# Patient Record
Sex: Male | Born: 1955 | Race: White | Hispanic: No | Marital: Married | Smoking: Former smoker
Health system: Southern US, Community
[De-identification: ages and names within clinical notes are randomized; demographics above are authoritative.]

## PROBLEM LIST (undated history)

## (undated) DIAGNOSIS — E785 Hyperlipidemia, unspecified: Secondary | ICD-10-CM

## (undated) DIAGNOSIS — I10 Essential (primary) hypertension: Secondary | ICD-10-CM

## (undated) DIAGNOSIS — F329 Major depressive disorder, single episode, unspecified: Secondary | ICD-10-CM

## (undated) DIAGNOSIS — E119 Type 2 diabetes mellitus without complications: Secondary | ICD-10-CM

## (undated) DIAGNOSIS — F32A Depression, unspecified: Secondary | ICD-10-CM

## (undated) HISTORY — DX: Type 2 diabetes mellitus without complications: E11.9

## (undated) HISTORY — DX: Major depressive disorder, single episode, unspecified: F32.9

## (undated) HISTORY — DX: Hyperlipidemia, unspecified: E78.5

## (undated) HISTORY — DX: Depression, unspecified: F32.A

## (undated) HISTORY — DX: Essential (primary) hypertension: I10

## (undated) HISTORY — PX: CERVICAL DISCECTOMY: SHX98

---

## 2008-04-29 ENCOUNTER — Emergency Department (HOSPITAL_COMMUNITY): Admission: EM | Admit: 2008-04-29 | Discharge: 2008-04-29 | Payer: Self-pay | Admitting: Family Medicine

## 2008-06-14 LAB — HM COLONOSCOPY

## 2010-05-04 ENCOUNTER — Encounter: Payer: Self-pay | Admitting: Internal Medicine

## 2010-05-04 ENCOUNTER — Ambulatory Visit (INDEPENDENT_AMBULATORY_CARE_PROVIDER_SITE_OTHER): Payer: Managed Care, Other (non HMO) | Admitting: Internal Medicine

## 2010-05-04 DIAGNOSIS — Z860100 Personal history of colon polyps, unspecified: Secondary | ICD-10-CM | POA: Insufficient documentation

## 2010-05-04 DIAGNOSIS — Z0000 Encounter for general adult medical examination without abnormal findings: Secondary | ICD-10-CM

## 2010-05-04 DIAGNOSIS — E349 Endocrine disorder, unspecified: Secondary | ICD-10-CM

## 2010-05-04 DIAGNOSIS — I10 Essential (primary) hypertension: Secondary | ICD-10-CM | POA: Insufficient documentation

## 2010-05-04 DIAGNOSIS — E291 Testicular hypofunction: Secondary | ICD-10-CM

## 2010-05-04 MED ORDER — TESTOSTERONE 20.25 MG/ACT (1.62%) TD GEL: 2.0000 "application " | TRANSDERMAL | Status: DC

## 2010-05-04 MED ORDER — LOSARTAN POTASSIUM 50 MG PO TABS: 25.0000 mg | ORAL_TABLET | Freq: Every day | ORAL | Status: DC

## 2010-05-04 NOTE — Patient Instructions (Signed)
Please check your blood pressure on a regular basis.  If it is consistently greater than 150/90, please make an office appointment.  Limit your sodium (Salt) intake    It is important that you exercise regularly, at least 20 minutes 3 to 4 times per week.  If you develop chest pain or shortness of breath seek  medical attention.  You need to lose weight.  Consider a lower calorie diet and regular exercise.  Return in 6 months for follow-up

## 2010-05-04 NOTE — Progress Notes (Signed)
  Subjective:    Patient ID: Matthew Lambert, male    DOB: 1955/04/28, 55 y.o.   MRN: [id number]  HPI  55 year old patient who is seen today to establish with our practice. He has a five-year history of treated hypertension and has down titrated his losartan to 25 mg every other day. He is exercising regularly and losing weight and does monitor home blood pressure readings frequently. Blood pressure readings are generally normal to high normal. He has a history of colonic polyps and his last colonoscopy was in June of 2010. He has cervical disc disease and is status post cervical discectomy in 1989 in [place]. He has testosterone deficiency and had local allergic reaction to Androderm patch.    Review of Systems  Constitutional: Negative for fever, chills, activity change, appetite change and fatigue.  HENT: Negative for hearing loss, ear pain, congestion, rhinorrhea, sneezing, mouth sores, trouble swallowing, neck pain, neck stiffness, dental problem, voice change, sinus pressure and tinnitus.   Eyes: Negative for photophobia, pain, redness and visual disturbance.  Respiratory: Negative for apnea, cough, choking, chest tightness, shortness of breath and wheezing.   Cardiovascular: Negative for chest pain, palpitations and leg swelling.  Gastrointestinal: Negative for nausea, vomiting, abdominal pain, diarrhea, constipation, blood in stool, abdominal distention, anal bleeding and rectal pain.  Genitourinary: Negative for dysuria, urgency, frequency, hematuria, flank pain, decreased urine volume, discharge, penile swelling, scrotal swelling, difficulty urinating, genital sores and testicular pain.  Musculoskeletal: Negative for myalgias, back pain, joint swelling, arthralgias and gait problem.  Skin: Negative for color change, rash and wound.  Neurological: Negative for dizziness, tremors, seizures, syncope, facial asymmetry, speech difficulty, weakness, light-headedness, numbness and  headaches.  Hematological: Negative for adenopathy. Does not bruise/bleed easily.  Psychiatric/Behavioral: Negative for suicidal ideas, hallucinations, behavioral problems, confusion, sleep disturbance, self-injury, dysphoric mood, decreased concentration and agitation. The patient is not nervous/anxious.        Objective:   Physical Exam  Constitutional: He appears well-developed and well-nourished.       Mildly overweight. Blood pressure 140/80  HENT:  Head: Normocephalic and atraumatic.  Right Ear: External ear normal.  Left Ear: External ear normal.  Nose: Nose normal.  Mouth/Throat: Oropharynx is clear and moist.  Eyes: Conjunctivae and EOM are normal. Pupils are equal, round, and reactive to light. No scleral icterus.  Neck: Normal range of motion. Neck supple. No JVD present. No thyromegaly present.  Cardiovascular: Regular rhythm, normal heart sounds and intact distal pulses.  Exam reveals no gallop and no friction rub.   No murmur heard. Pulmonary/Chest: Effort normal and breath sounds normal. He exhibits no tenderness.  Abdominal: Soft. Bowel sounds are normal. He exhibits no distension and no mass. There is no tenderness.  Genitourinary: Penis normal.  Musculoskeletal: Normal range of motion. He exhibits no edema and no tenderness.  Lymphadenopathy:    He has no cervical adenopathy.  Neurological: He is alert. He has normal reflexes. No cranial nerve deficit. Coordination normal.  Skin: Skin is warm and dry. No rash noted.  Psychiatric: He has a normal mood and affect. His behavior is normal.          Assessment & Plan:   Hypertension. Have encouraged the patient to take losartan 25 mg daily and will continue aggressive lifestyle changes and close outpatient monitoring. Colonic polyps Testosterone deficiency. We'll resume replacement medication

## 2010-05-09 ENCOUNTER — Telehealth: Payer: Self-pay | Admitting: Internal Medicine

## 2010-05-09 MED ORDER — TESTOSTERONE 20.25 MG/ACT (1.62%) TD GEL: 2.0000 "application " | TRANSDERMAL | Status: DC

## 2010-05-09 NOTE — Telephone Encounter (Signed)
Please advise - did you mean qd? Instead of 1 dose over 46hours?

## 2010-05-09 NOTE — Telephone Encounter (Signed)
Spoke with pharmacy r/t rx directions. KIK Spoke with wife about call to pharmacy

## 2010-05-09 NOTE — Telephone Encounter (Signed)
Daily is correct.

## 2010-05-09 NOTE — Telephone Encounter (Signed)
Pt called and said that [facility] at [address] needs clarification on the Androgel # of refills and also on dosage instructions. Pls call pharmacist at [phone]

## 2010-05-23 ENCOUNTER — Encounter: Payer: Self-pay | Admitting: Internal Medicine

## 2010-10-30 ENCOUNTER — Other Ambulatory Visit (INDEPENDENT_AMBULATORY_CARE_PROVIDER_SITE_OTHER): Payer: Managed Care, Other (non HMO)

## 2010-10-30 DIAGNOSIS — Z0000 Encounter for general adult medical examination without abnormal findings: Secondary | ICD-10-CM

## 2010-10-30 LAB — HEPATIC FUNCTION PANEL
ALT: 16 U/L (ref 0–53)
AST: 17 U/L (ref 0–37)
Albumin: 3.8 g/dL (ref 3.5–5.2)
Alkaline Phosphatase: 53 U/L (ref 39–117)
Bilirubin, Direct: 0.1 mg/dL (ref 0.0–0.3)
Total Bilirubin: 0.8 mg/dL (ref 0.3–1.2)
Total Protein: 6.2 g/dL (ref 6.0–8.3)

## 2010-10-30 LAB — PSA: PSA: 0.69 ng/mL (ref 0.10–4.00)

## 2010-10-30 LAB — CBC WITH DIFFERENTIAL/PLATELET
Basophils Absolute: 0 10*3/uL (ref 0.0–0.1)
Basophils Relative: 0.4 % (ref 0.0–3.0)
Eosinophils Absolute: 0.1 10*3/uL (ref 0.0–0.7)
Eosinophils Relative: 1.8 % (ref 0.0–5.0)
HCT: 43.2 % (ref 39.0–52.0)
Hemoglobin: 14.6 g/dL (ref 13.0–17.0)
Lymphocytes Relative: 32.1 % (ref 12.0–46.0)
Lymphs Abs: 1.9 10*3/uL (ref 0.7–4.0)
MCHC: 33.9 g/dL (ref 30.0–36.0)
MCV: 88.2 fl (ref 78.0–100.0)
Monocytes Absolute: 0.6 10*3/uL (ref 0.1–1.0)
Monocytes Relative: 9.6 % (ref 3.0–12.0)
Neutro Abs: 3.3 10*3/uL (ref 1.4–7.7)
Neutrophils Relative %: 56.1 % (ref 43.0–77.0)
Platelets: 209 10*3/uL (ref 150.0–400.0)
RBC: 4.89 Mil/uL (ref 4.22–5.81)
RDW: 13.2 % (ref 11.5–14.6)
WBC: 5.9 10*3/uL (ref 4.5–10.5)

## 2010-10-30 LAB — TSH: TSH: 1.76 u[IU]/mL (ref 0.35–5.50)

## 2010-10-30 LAB — POCT URINALYSIS DIPSTICK
Bilirubin, UA: NEGATIVE
Blood, UA: NEGATIVE
Glucose, UA: NEGATIVE
Ketones, UA: NEGATIVE
Leukocytes, UA: NEGATIVE
Nitrite, UA: NEGATIVE
Protein, UA: NEGATIVE
Spec Grav, UA: 1.02
Urobilinogen, UA: 0.2
pH, UA: 5.5

## 2010-10-30 LAB — BASIC METABOLIC PANEL
BUN: 20 mg/dL (ref 6–23)
CO2: 28 mEq/L (ref 19–32)
Calcium: 8.7 mg/dL (ref 8.4–10.5)
Chloride: 104 mEq/L (ref 96–112)
Creatinine, Ser: 1 mg/dL (ref 0.4–1.5)
GFR: 78.46 mL/min (ref >60.00)
Glucose, Bld: 105 mg/dL — ABNORMAL HIGH (ref 70–99)
Potassium: 4.3 mEq/L (ref 3.5–5.1)
Sodium: 140 mEq/L (ref 135–145)

## 2010-10-30 LAB — LIPID PANEL
Cholesterol: 169 mg/dL (ref 0–200)
HDL: 44.8 mg/dL (ref >39.00)
LDL Cholesterol: 101 mg/dL — ABNORMAL HIGH (ref 0–99)
Total CHOL/HDL Ratio: 4
Triglycerides: 117 mg/dL (ref 0.0–149.0)
VLDL: 23.4 mg/dL (ref 0.0–40.0)

## 2010-11-06 ENCOUNTER — Encounter: Payer: Managed Care, Other (non HMO) | Admitting: Internal Medicine

## 2010-11-15 ENCOUNTER — Encounter: Payer: Self-pay | Admitting: Internal Medicine

## 2010-11-15 ENCOUNTER — Ambulatory Visit (INDEPENDENT_AMBULATORY_CARE_PROVIDER_SITE_OTHER): Payer: Managed Care, Other (non HMO) | Admitting: Internal Medicine

## 2010-11-15 VITALS — BP 130/80 | HR 68 | Temp 97.8°F | Resp 16 | Ht 70.5 in | Wt 204.0 lb

## 2010-11-15 DIAGNOSIS — E291 Testicular hypofunction: Secondary | ICD-10-CM

## 2010-11-15 DIAGNOSIS — Z86010 Personal history of colonic polyps: Secondary | ICD-10-CM

## 2010-11-15 DIAGNOSIS — I10 Essential (primary) hypertension: Secondary | ICD-10-CM

## 2010-11-15 DIAGNOSIS — Z23 Encounter for immunization: Secondary | ICD-10-CM

## 2010-11-15 DIAGNOSIS — E349 Endocrine disorder, unspecified: Secondary | ICD-10-CM

## 2010-11-15 DIAGNOSIS — Z0000 Encounter for general adult medical examination without abnormal findings: Secondary | ICD-10-CM

## 2010-11-15 MED ORDER — LOSARTAN POTASSIUM 50 MG PO TABS: 50.0000 mg | ORAL_TABLET | Freq: Every day | ORAL | Status: DC

## 2010-11-15 MED ORDER — TESTOSTERONE 20.25 MG/ACT (1.62%) TD GEL: 2.0000 "application " | TRANSDERMAL | Status: DC

## 2010-11-15 NOTE — Patient Instructions (Signed)
Return in one year for follow-up  Please check your blood pressure on a regular basis.  If it is consistently greater than 150/90, please make an office appointment.  Limit your sodium (Salt) intake    It is important that you exercise regularly, at least 20 minutes 3 to 4 times per week.  If you develop chest pain or shortness of breath seek  medical attention.

## 2010-11-15 NOTE — Progress Notes (Signed)
Subjective:    Patient ID: Matthew Lambert, male    DOB: [birth year], 55 y.o.   MRN: [id number]  HPI 55 year old patient who is seen today for an annual exam. He has treated hypertension history of testosterone deficiency and also a history of colonic polyps. His last colonoscopy was 2010. He is on a 5 year interval. No concerns or complaints. He remains quite active with biking and has no exercise limitations. He denies any cardiopulmonary complaints. He does have a brother who is status post CABG.   Past Medical History  Diagnosis Date  . Depression   . Hypertension   . Hyperlipidemia    Past Surgical History  Procedure Date  . Cervical discectomy     reports that he quit smoking about 13 years ago. He has never used smokeless tobacco. He reports that he drinks alcohol. He reports that he does not use illicit drugs. family history is not on file. No Known Allergies Review of Systems  Constitutional: Negative for fever, chills, activity change, appetite change and fatigue.  HENT: Negative for hearing loss, ear pain, congestion, rhinorrhea, sneezing, mouth sores, trouble swallowing, neck pain, neck stiffness, dental problem, voice change, sinus pressure and tinnitus.   Eyes: Negative for photophobia, pain, redness and visual disturbance.  Respiratory: Negative for apnea, cough, choking, chest tightness, shortness of breath and wheezing.   Cardiovascular: Negative for chest pain, palpitations and leg swelling.  Gastrointestinal: Negative for nausea, vomiting, abdominal pain, diarrhea, constipation, blood in stool, abdominal distention, anal bleeding and rectal pain.  Genitourinary: Negative for dysuria, urgency, frequency, hematuria, flank pain, decreased urine volume, discharge, penile swelling, scrotal swelling, difficulty urinating, genital sores and testicular pain.  Musculoskeletal: Negative for myalgias, back pain, joint swelling, arthralgias and gait problem.  Skin: Negative for color  change, rash and wound.  Neurological: Negative for dizziness, tremors, seizures, syncope, facial asymmetry, speech difficulty, weakness, light-headedness, numbness and headaches.  Hematological: Negative for adenopathy. Does not bruise/bleed easily.  Psychiatric/Behavioral: Negative for suicidal ideas, hallucinations, behavioral problems, confusion, sleep disturbance, self-injury, dysphoric mood, decreased concentration and agitation. The patient is not nervous/anxious.        Objective:   Physical Exam  Constitutional: He appears well-developed and well-nourished.  HENT:  Head: Normocephalic and atraumatic.  Right Ear: External ear normal.  Left Ear: External ear normal.  Nose: Nose normal.  Mouth/Throat: Oropharynx is clear and moist.  Eyes: Conjunctivae and EOM are normal. Pupils are equal, round, and reactive to light. No scleral icterus.  Neck: Normal range of motion. Neck supple. No JVD present. No thyromegaly present.  Cardiovascular: Regular rhythm, normal heart sounds and intact distal pulses.  Exam reveals no gallop and no friction rub.   No murmur heard. Pulmonary/Chest: Effort normal and breath sounds normal. He exhibits no tenderness.  Abdominal: Soft. Bowel sounds are normal. He exhibits no distension and no mass. There is no tenderness.  Genitourinary: Prostate normal and penis normal.  Musculoskeletal: Normal range of motion. He exhibits no edema and no tenderness.  Lymphadenopathy:    He has no cervical adenopathy.  Neurological: He is alert. He has normal reflexes. No cranial nerve deficit. Coordination normal.  Skin: Skin is warm and dry. No rash noted.  Psychiatric: He has a normal mood and affect. His behavior is normal.          Assessment & Plan:   Preventive health care Hypertension well controlled. We'll continue present regimen and home blood pressure monitoring Colonic polyps followup colonoscopy in 3 years Testosterone  deficiency. We'll continue  present treatment  Recheck one year

## 2010-11-22 ENCOUNTER — Other Ambulatory Visit: Payer: Self-pay | Admitting: Internal Medicine

## 2010-11-22 NOTE — Telephone Encounter (Signed)
Called in.

## 2011-11-01 ENCOUNTER — Other Ambulatory Visit: Payer: Managed Care, Other (non HMO)

## 2011-11-16 ENCOUNTER — Encounter: Payer: Managed Care, Other (non HMO) | Admitting: Internal Medicine

## 2018-10-27 DIAGNOSIS — I10 Essential (primary) hypertension: Secondary | ICD-10-CM | POA: Diagnosis not present

## 2018-10-27 DIAGNOSIS — Z8249 Family history of ischemic heart disease and other diseases of the circulatory system: Secondary | ICD-10-CM | POA: Diagnosis not present

## 2018-10-27 DIAGNOSIS — Z0000 Encounter for general adult medical examination without abnormal findings: Secondary | ICD-10-CM | POA: Diagnosis not present

## 2018-10-28 DIAGNOSIS — Z0000 Encounter for general adult medical examination without abnormal findings: Secondary | ICD-10-CM | POA: Diagnosis not present

## 2018-10-28 DIAGNOSIS — Z13220 Encounter for screening for lipoid disorders: Secondary | ICD-10-CM | POA: Diagnosis not present

## 2019-04-06 DIAGNOSIS — R7309 Other abnormal glucose: Secondary | ICD-10-CM | POA: Diagnosis not present

## 2019-04-20 DIAGNOSIS — I10 Essential (primary) hypertension: Secondary | ICD-10-CM | POA: Diagnosis not present

## 2019-04-20 DIAGNOSIS — R739 Hyperglycemia, unspecified: Secondary | ICD-10-CM | POA: Diagnosis not present

## 2019-10-06 DIAGNOSIS — R7301 Impaired fasting glucose: Secondary | ICD-10-CM | POA: Diagnosis not present

## 2019-10-06 DIAGNOSIS — Z125 Encounter for screening for malignant neoplasm of prostate: Secondary | ICD-10-CM | POA: Diagnosis not present

## 2019-10-06 DIAGNOSIS — Z0000 Encounter for general adult medical examination without abnormal findings: Secondary | ICD-10-CM | POA: Diagnosis not present

## 2019-10-06 DIAGNOSIS — I10 Essential (primary) hypertension: Secondary | ICD-10-CM | POA: Diagnosis not present

## 2019-10-06 DIAGNOSIS — R7309 Other abnormal glucose: Secondary | ICD-10-CM | POA: Diagnosis not present

## 2019-10-15 DIAGNOSIS — R739 Hyperglycemia, unspecified: Secondary | ICD-10-CM | POA: Diagnosis not present

## 2019-10-15 DIAGNOSIS — Z23 Encounter for immunization: Secondary | ICD-10-CM | POA: Diagnosis not present

## 2019-10-15 DIAGNOSIS — D485 Neoplasm of uncertain behavior of skin: Secondary | ICD-10-CM | POA: Diagnosis not present

## 2019-10-15 DIAGNOSIS — I10 Essential (primary) hypertension: Secondary | ICD-10-CM | POA: Diagnosis not present

## 2019-10-15 DIAGNOSIS — Z8249 Family history of ischemic heart disease and other diseases of the circulatory system: Secondary | ICD-10-CM | POA: Diagnosis not present

## 2019-10-15 DIAGNOSIS — Z1212 Encounter for screening for malignant neoplasm of rectum: Secondary | ICD-10-CM | POA: Diagnosis not present

## 2019-10-15 DIAGNOSIS — Z0001 Encounter for general adult medical examination with abnormal findings: Secondary | ICD-10-CM | POA: Diagnosis not present

## 2019-10-26 DIAGNOSIS — I25119 Atherosclerotic heart disease of native coronary artery with unspecified angina pectoris: Secondary | ICD-10-CM | POA: Diagnosis not present

## 2019-10-26 DIAGNOSIS — E119 Type 2 diabetes mellitus without complications: Secondary | ICD-10-CM | POA: Diagnosis not present

## 2019-11-04 ENCOUNTER — Encounter: Payer: Self-pay | Admitting: General Practice

## 2019-11-09 DIAGNOSIS — I25119 Atherosclerotic heart disease of native coronary artery with unspecified angina pectoris: Secondary | ICD-10-CM | POA: Diagnosis not present

## 2019-11-10 ENCOUNTER — Telehealth: Payer: Self-pay | Admitting: Cardiology

## 2019-11-10 NOTE — Telephone Encounter (Signed)
Patient's wife called to ensure that our office has received patient's CT calcium score. She states [facility] faxed it to our office. She is unaware of when it was faxed, however, she would like to ensure that we have the results prior to appointment scheduled for 11/12/19 with Dr. Jens Som. Please call.

## 2019-11-10 NOTE — Telephone Encounter (Signed)
Patient's wife called to check on the status regarding if we have received the fax results. Advised we are waiting to find out currently and would call her when we know. Patient verbalized she is okay with this.

## 2019-11-11 DIAGNOSIS — I10 Essential (primary) hypertension: Secondary | ICD-10-CM | POA: Diagnosis not present

## 2019-11-11 DIAGNOSIS — I25119 Atherosclerotic heart disease of native coronary artery with unspecified angina pectoris: Secondary | ICD-10-CM | POA: Diagnosis not present

## 2019-11-11 DIAGNOSIS — R7303 Prediabetes: Secondary | ICD-10-CM | POA: Diagnosis not present

## 2019-11-11 NOTE — Telephone Encounter (Signed)
Left message for pt wife, aware CT results obtained via care everywhere.

## 2019-11-12 ENCOUNTER — Other Ambulatory Visit: Payer: Self-pay

## 2019-11-12 ENCOUNTER — Ambulatory Visit (INDEPENDENT_AMBULATORY_CARE_PROVIDER_SITE_OTHER): Payer: BC Managed Care – PPO | Admitting: Cardiology

## 2019-11-12 ENCOUNTER — Encounter: Payer: Self-pay | Admitting: Cardiology

## 2019-11-12 VITALS — BP 150/73 | HR 55 | Temp 96.8°F | Ht 71.0 in | Wt 219.2 lb

## 2019-11-12 DIAGNOSIS — R072 Precordial pain: Secondary | ICD-10-CM | POA: Diagnosis not present

## 2019-11-12 DIAGNOSIS — I2510 Atherosclerotic heart disease of native coronary artery without angina pectoris: Secondary | ICD-10-CM | POA: Diagnosis not present

## 2019-11-12 DIAGNOSIS — E7800 Pure hypercholesterolemia, unspecified: Secondary | ICD-10-CM

## 2019-11-12 DIAGNOSIS — I10 Essential (primary) hypertension: Secondary | ICD-10-CM | POA: Diagnosis not present

## 2019-11-12 DIAGNOSIS — R0989 Other specified symptoms and signs involving the circulatory and respiratory systems: Secondary | ICD-10-CM

## 2019-11-12 MED ORDER — METOPROLOL TARTRATE 50 MG PO TABS: ORAL_TABLET | ORAL | 0 refills | Status: DC

## 2019-11-12 MED ORDER — METOPROLOL TARTRATE 50 MG PO TABS
ORAL_TABLET | ORAL | 0 refills | Status: DC
Start: 2019-11-12 — End: 2020-05-03

## 2019-11-12 MED ORDER — ROSUVASTATIN CALCIUM 40 MG PO TABS: 40.0000 mg | ORAL_TABLET | Freq: Every day | ORAL | 3 refills | Status: DC

## 2019-11-12 NOTE — Progress Notes (Signed)
Referring-Matthew Renne Crigler, MD Reason for referral-elevated calcium score and exertional chest pain  HPI: 64 year old male for evaluation of elevated calcium score and exertional chest pain at request of Merri Brunette, MD.  Calcium score October 2021 439.  Patient has dyspnea with more vigorous activities.  He denies dyspnea with routine activities.  No orthopnea, PND, pedal edema or syncope.  No claudication.  He did have an episode of chest discomfort back in the spring that occurred with vigorous activities and resolve with rest.  He has had no chest pain since then.  Cardiology now asked to evaluate.  Current Outpatient Medications  Medication Sig Dispense Refill  . acetaminophen (TYLENOL) 650 MG CR tablet Take by mouth.    . calcium carbonate (OS-CAL) 1250 (500 Ca) MG chewable tablet Chew by mouth.    . [facility] ASPIRIN EC 81 MG EC tablet 81 mg.    . ibuprofen (ADVIL) 200 MG tablet Take by mouth.    . losartan (COZAAR) 50 MG tablet Take 1 tablet (50 mg total) by mouth daily. 30 day rx please (Patient taking differently: Take 25 mg by mouth daily. 30 day rx please TAKE 25MG  TOTAL) 90 tablet 6  . nitroGLYCERIN (NITROSTAT) 0.4 MG SL tablet SMARTSIG:1 Sublingual Every Night    . rosuvastatin (CRESTOR) 10 MG tablet Take 10 mg by mouth at bedtime.     No current facility-administered medications for this visit.    No Known Allergies   Past Medical History:  Diagnosis Date  . Depression   . Diabetes mellitus (HCC)   . Hyperlipidemia   . Hypertension     Past Surgical History:  Procedure Laterality Date  . CERVICAL DISCECTOMY      Social History   Socioeconomic History  . Marital status: Married    Spouse name: Not on file  . Number of children: 2  . Years of education: Not on file  . Highest education level: Not on file  Occupational History  . Not on file  Tobacco Use  . Smoking status: Former Smoker    Quit date: 01/01/1997    Years since quitting: 22.8  . Smokeless  tobacco: Never Used  Substance and Sexual Activity  . Alcohol use: Yes    Comment: Occasional  . Drug use: No  . Sexual activity: Not on file  Other Topics Concern  . Not on file  Social History Narrative  . Not on file   Social Determinants of Health   Financial Resource Strain:   . Difficulty of Paying Living Expenses: Not on file  Food Insecurity:   . Worried About 03/01/1997 in the Last Year: Not on file  . Ran Out of Food in the Last Year: Not on file  Transportation Needs:   . Lack of Transportation (Medical): Not on file  . Lack of Transportation (Non-Medical): Not on file  Physical Activity:   . Days of Exercise per Week: Not on file  . Minutes of Exercise per Session: Not on file  Stress:   . Feeling of Stress : Not on file  Social Connections:   . Frequency of Communication with Friends and Family: Not on file  . Frequency of Social Gatherings with Friends and Family: Not on file  . Attends Religious Services: Not on file  . Active Member of Clubs or Organizations: Not on file  . Attends Programme researcher, broadcasting/film/video Meetings: Not on file  . Marital Status: Not on file  Intimate Partner Violence:   .  Fear of Current or Ex-Partner: Not on file  . Emotionally Abused: Not on file  . Physically Abused: Not on file  . Sexually Abused: Not on file    Family History  Problem Relation Age of Onset  . CAD Father   . CAD Brother     ROS: no fevers or chills, productive cough, hemoptysis, dysphasia, odynophagia, melena, hematochezia, dysuria, hematuria, rash, seizure activity, orthopnea, PND, pedal edema, claudication. Remaining systems are negative.  Physical Exam:   Blood pressure (!) 150/73, pulse (!) 55, temperature (!) 96.8 F (36 C), height 5\' 11"  (1.803 m), weight 219 lb 3.2 oz (99.4 kg), SpO2 97 %.  General:  Well developed/well nourished in NAD Skin warm/dry Patient not depressed No peripheral clubbing Back-normal HEENT-normal/normal eyelids Neck  supple/normal carotid upstroke bilaterally; no bruits; no JVD; no thyromegaly chest - CTA/ normal expansion CV - RRR/normal S1 and S2; no murmurs, rubs or gallops;  PMI nondisplaced Abdomen -NT/ND, no HSM, no mass, + bowel sounds, positive bruit 2+ femoral pulses, no bruits Ext-no edema, chords, 2+ DP Neuro-grossly nonfocal  ECG -sinus bradycardia at a rate of 55, no ST changes.  Personally reviewed  A/P  1 chest pain-patient had an episode of chest discomfort with vigorous activities in the spring.  He also notes dyspnea on exertion.  He has a strong family history of coronary disease with his brother having bypass in his 61s and his father having his first myocardial infarction in his 68s as well.  We will arrange a cardiac CTA to exclude obstructive disease.  2 coronary artery disease-based on elevated calcium score.  We will treat with aspirin and statin.  3 hypertension-blood pressure mildly elevated.  I have asked him to follow this and we will advance losartan or add additional medications as needed.  4 hyperlipidemia-given documented coronary artery disease (elevated calcium score) we will increase Crestor to 40 mg daily.  Check lipids and liver in 12 weeks.  5 abdominal bruit-schedule ultrasound to exclude aneurysm.  55s, MD

## 2019-11-12 NOTE — Patient Instructions (Signed)
Medication Instructions:   INCREASE ROSUVASTATIN TO 40 MG ONCE DAILY= 4 OF THE 10 MG TABLETS ONCE DAILY  *If you need a refill on your cardiac medications before your next appointment, please call your pharmacy*   Lab Work:  Your physician recommends that you return for lab work in: [facility]  If you have labs (blood work) drawn today and your tests are completely normal, you will receive your results only by: Marland Kitchen MyChart Message (if you have MyChart) OR . A paper copy in the mail If you have any lab test that is abnormal or we need to change your treatment, we will call you to review the results.   Testing/Procedures:  Your physician has requested that you have an abdominal aorta duplex. During this test, an ultrasound is used to evaluate the aorta. Allow 30 minutes for this exam. Do not eat after midnight the day before and avoid carbonated beverages [facility]  Your cardiac CT will be scheduled at one of the below locations:   [facility] [address] [place], [place] [address] [phone]   If scheduled at [facility], please arrive at the [facility] main entrance of [facility] 30 minutes prior to test start time. Proceed to the [facility] Radiology Department (first floor) to check-in and test prep.   Please follow these instructions carefully (unless otherwise directed):  Hold all erectile dysfunction medications at least 3 days (72 hrs) prior to test.  On the Night Before the Test: . Be sure to Drink plenty of water. . Do not consume any caffeinated/decaffeinated beverages or chocolate 12 hours prior to your test. . Do not take any antihistamines 12 hours prior to your test.  On the Day of the Test: . Drink plenty of water. Do not drink any water within one hour of the test. . Do not eat any food 4 hours prior to the test. . You may take your regular medications prior to the test.  . Take metoprolol (Lopressor) 50 MG  two hours prior to test. . HOLD Furosemide/Hydrochlorothiazide morning of the test.       After the Test: . Drink plenty of water. . After receiving IV contrast, you may experience a mild flushed feeling. This is normal. . On occasion, you may experience a mild rash up to 24 hours after the test. This is not dangerous. If this occurs, you can take Benadryl 25 mg and increase your fluid intake. . If you experience trouble breathing, this can be serious. If it is severe call 911 IMMEDIATELY. If it is mild, please call our office. . If you take any of these medications: Glipizide/Metformin, Avandament, Glucavance, please do not take 48 hours after completing test unless otherwise instructed.   Once we have confirmed authorization from your insurance company, we will call you to set up a date and time for your test. Based on how quickly your insurance processes prior authorizations requests, please allow up to 4 weeks to be contacted for scheduling your Cardiac CT appointment. Be advised that routine Cardiac CT appointments could be scheduled as many as 8 weeks after your provider has ordered it.  For non-scheduling related questions, please contact the cardiac imaging nurse navigator should you have any questions/concerns: Marchia Bond, Cardiac Imaging Nurse Navigator Burley Saver, Interim Cardiac Imaging Nurse [place] [facility] Direct Office Dial: [phone]   For scheduling needs, including cancellations and rescheduling, please call Vivien Rota at [phone], option 3.  Follow-Up: At [facility], you and your health needs are our priority.  As part of our continuing mission to provide you with exceptional heart care, we have created designated Provider Care Teams.  These Care Teams include your primary Cardiologist (physician) and Advanced Practice Providers (APPs -  Physician Assistants and Nurse Practitioners) who all work together to provide you with the care  you need, when you need it.  We recommend signing up for the patient portal called "MyChart".  Sign up information is provided on this After Visit Summary.  MyChart is used to connect with patients for Virtual Visits (Telemedicine).  Patients are able to view lab/test results, encounter notes, upcoming appointments, etc.  Non-urgent messages can be sent to your provider as well.   To learn more about what you can do with MyChart, go to [email or web address].    Your next appointment:   3 month(s)  The format for your next appointment:   In Person  Provider:   Kirk Ruths, MD

## 2019-11-13 ENCOUNTER — Ambulatory Visit (HOSPITAL_COMMUNITY)
Admission: RE | Admit: 2019-11-13 | Discharge: 2019-11-13 | Disposition: A | Discharge status: Home / Self Care | Payer: BC Managed Care – PPO | Source: Ambulatory Visit | Attending: Cardiology | Admitting: Cardiology

## 2019-11-13 ENCOUNTER — Encounter: Payer: Self-pay | Admitting: *Deleted

## 2019-11-13 DIAGNOSIS — R0989 Other specified symptoms and signs involving the circulatory and respiratory systems: Secondary | ICD-10-CM | POA: Insufficient documentation

## 2019-11-30 ENCOUNTER — Other Ambulatory Visit: Payer: Self-pay | Admitting: *Deleted

## 2019-11-30 DIAGNOSIS — R072 Precordial pain: Secondary | ICD-10-CM

## 2019-11-30 DIAGNOSIS — E7800 Pure hypercholesterolemia, unspecified: Secondary | ICD-10-CM | POA: Diagnosis not present

## 2019-12-01 ENCOUNTER — Encounter: Payer: Self-pay | Admitting: *Deleted

## 2019-12-01 LAB — HEPATIC FUNCTION PANEL
ALT: 15 IU/L (ref 0–44)
AST: 14 IU/L (ref 0–40)
Albumin: 4.3 g/dL (ref 3.8–4.8)
Alkaline Phosphatase: 84 IU/L (ref 44–121)
Bilirubin Total: 0.5 mg/dL (ref 0.0–1.2)
Bilirubin, Direct: 0.16 mg/dL (ref 0.00–0.40)
Total Protein: 6.7 g/dL (ref 6.0–8.5)

## 2019-12-01 LAB — LIPID PANEL
Chol/HDL Ratio: 2.6 ratio (ref 0.0–5.0)
Cholesterol, Total: 126 mg/dL (ref 100–199)
HDL: 48 mg/dL (ref >39)
LDL Chol Calc (NIH): 62 mg/dL (ref 0–99)
Triglycerides: 84 mg/dL (ref 0–149)
VLDL Cholesterol Cal: 16 mg/dL (ref 5–40)

## 2019-12-04 ENCOUNTER — Telehealth (HOSPITAL_COMMUNITY): Payer: Self-pay | Admitting: Emergency Medicine

## 2019-12-04 NOTE — Telephone Encounter (Signed)
Attempted to call patient regarding upcoming cardiac CT appointment. Left message on voicemail with name and callback number Rockwell Alexandria RN Navigator Cardiac Imaging [facility] [phone] Office [phone] Cell   Also requested lab draw in messge

## 2019-12-07 ENCOUNTER — Telehealth (HOSPITAL_COMMUNITY): Payer: Self-pay | Admitting: Emergency Medicine

## 2019-12-07 NOTE — Telephone Encounter (Signed)
Reaching out to patient to offer assistance regarding upcoming cardiac imaging study; pt verbalizes understanding of appt date/time, parking situation and where to check in, pre-test NPO status and medications ordered, and verified current allergies; name and call back number provided for further questions should they arise Rockwell Alexandria RN Navigator Cardiac Imaging [facility] [phone] office [phone] cell   50mg  metop tart 2 hr prior -pt verbalized understanding

## 2019-12-07 NOTE — Telephone Encounter (Signed)
Attempted to call patient regarding upcoming cardiac CT appointment. °Left message on voicemail with name and callback number °Lynelle Weiler RN Navigator Cardiac Imaging °[facility] [facility] °[phone] Office °[phone] Cell ° °

## 2019-12-08 ENCOUNTER — Ambulatory Visit (HOSPITAL_COMMUNITY)
Admission: RE | Admit: 2019-12-08 | Discharge: 2019-12-08 | Disposition: A | Discharge status: Home / Self Care | Payer: BC Managed Care – PPO | Source: Ambulatory Visit | Attending: Cardiology | Admitting: Cardiology

## 2019-12-08 ENCOUNTER — Other Ambulatory Visit: Payer: Self-pay

## 2019-12-08 ENCOUNTER — Encounter (HOSPITAL_COMMUNITY): Payer: Self-pay

## 2019-12-08 DIAGNOSIS — R072 Precordial pain: Secondary | ICD-10-CM | POA: Diagnosis not present

## 2019-12-08 DIAGNOSIS — I2510 Atherosclerotic heart disease of native coronary artery without angina pectoris: Secondary | ICD-10-CM

## 2019-12-08 DIAGNOSIS — I700 Atherosclerosis of aorta: Secondary | ICD-10-CM

## 2019-12-08 LAB — POCT I-STAT CREATININE: Creatinine, Ser: 0.9 mg/dL (ref 0.61–1.24)

## 2019-12-08 MED ORDER — IOHEXOL 350 MG/ML SOLN
80.0000 mL | Freq: Once | INTRAVENOUS | Status: AC | PRN
  Administered 2019-12-08: 80 mL via INTRAVENOUS

## 2019-12-08 MED ORDER — NITROGLYCERIN 0.4 MG SL SUBL
SUBLINGUAL_TABLET | SUBLINGUAL | Status: AC
  Filled 2019-12-08: qty 2

## 2019-12-08 MED ORDER — NITROGLYCERIN 0.4 MG SL SUBL
0.8000 mg | SUBLINGUAL_TABLET | Freq: Once | SUBLINGUAL | Status: AC
  Administered 2019-12-08: 0.8 mg via SUBLINGUAL

## 2019-12-10 ENCOUNTER — Telehealth: Payer: Self-pay | Admitting: Cardiology

## 2019-12-10 NOTE — Telephone Encounter (Signed)
Pt called in and would like to know If someone could call him with his results   Best number -[phone]

## 2019-12-10 NOTE — Telephone Encounter (Signed)
Called pt. Initial results given. Still awaiting the FFR portion of the exam to result. Informed pt that when this happens that Stanton Kidney will be calling him.

## 2019-12-15 DIAGNOSIS — I2510 Atherosclerotic heart disease of native coronary artery without angina pectoris: Secondary | ICD-10-CM | POA: Diagnosis not present

## 2019-12-18 ENCOUNTER — Ambulatory Visit: Payer: BC Managed Care – PPO

## 2019-12-28 NOTE — Progress Notes (Signed)
HPI: FU coronary artery disease.  Patient seen November 2021 with chest pain.  Cardiac CT December 2021 showed calcium score 363, moderate (50 to 69%) stenosis in the OM and aortic atherosclerosis.  FFR 0.76 in the distal LAD and 0.63 and obtuse marginal suggestive of significant stenoses.  Abdominal ultrasound December 2021 showed no aneurysm.  Since last seen has some dyspnea on exertion but no orthopnea, PND, pedal edema, chest pain or syncope.  Current Outpatient Medications  Medication Sig Dispense Refill  . acetaminophen (TYLENOL) 650 MG CR tablet Take by mouth.    . calcium carbonate (OS-CAL) 1250 (500 Ca) MG chewable tablet Chew by mouth.    . [facility] ASPIRIN EC 81 MG EC tablet 81 mg.    . ibuprofen (ADVIL) 200 MG tablet Take by mouth.    . losartan (COZAAR) 50 MG tablet Take 1 tablet (50 mg total) by mouth daily. 30 day rx please (Patient taking differently: Take 25 mg by mouth daily. 30 day rx please TAKE 25MG  TOTAL) 90 tablet 6  . metoprolol tartrate (LOPRESSOR) 50 MG tablet TAKE 2 HOURS PRIOR TO CT SCAN 1 tablet 0  . nitroGLYCERIN (NITROSTAT) 0.4 MG SL tablet SMARTSIG:1 Sublingual Every Night    . rosuvastatin (CRESTOR) 40 MG tablet Take 1 tablet (40 mg total) by mouth at bedtime. 90 tablet 3   No current facility-administered medications for this visit.     Past Medical History:  Diagnosis Date  . Depression   . Diabetes mellitus (HCC)   . Hyperlipidemia   . Hypertension     Past Surgical History:  Procedure Laterality Date  . CERVICAL DISCECTOMY      Social History   Socioeconomic History  . Marital status: Married    Spouse name: Not on file  . Number of children: 2  . Years of education: Not on file  . Highest education level: Not on file  Occupational History  . Not on file  Tobacco Use  . Smoking status: Former Smoker    Quit date: 01/01/1997    Years since quitting: 23.0  . Smokeless tobacco: Never Used  Substance and Sexual Activity  . Alcohol  use: Yes    Comment: Occasional  . Drug use: No  . Sexual activity: Not on file  Other Topics Concern  . Not on file  Social History Narrative  . Not on file   Social Determinants of Health   Financial Resource Strain: Not on file  Food Insecurity: Not on file  Transportation Needs: Not on file  Physical Activity: Not on file  Stress: Not on file  Social Connections: Not on file  Intimate Partner Violence: Not on file    Family History  Problem Relation Age of Onset  . CAD Father   . CAD Brother     ROS: no fevers or chills, productive cough, hemoptysis, dysphasia, odynophagia, melena, hematochezia, dysuria, hematuria, rash, seizure activity, orthopnea, PND, pedal edema, claudication. Remaining systems are negative.  Physical Exam: Well-developed well-nourished in no acute distress.  Skin is warm and dry.  HEENT is normal.  Neck is supple.  Chest is clear to auscultation with normal expansion.  Cardiovascular exam is regular rate and rhythm.  Abdominal exam nontender or distended. No masses palpated. Extremities show no edema. neuro grossly intact  A/P  1 coronary artery disease-continue aspirin and statin.  We discussed options today.  His CTA does not suggest surgical anatomy.  Options would be to proceed with cardiac  catheterization versus medical therapy.  He does not appear to be having significant symptoms and he would prefer medical therapy at this point.  We will continue beta-blocker.  He will continue risk factor modification.  If he develops worsening symptoms we will plan cardiac catheterization at that point.  2 hypertension-patient's blood pressure is mildly elevated.  I have asked him to follow this and we will advance medications as needed.  3 hyperlipidemia-continue statin.  Check lipids and liver in 6 weeks.  Olga Millers, MD

## 2020-01-04 ENCOUNTER — Encounter: Payer: Self-pay | Admitting: Cardiology

## 2020-01-04 ENCOUNTER — Ambulatory Visit (INDEPENDENT_AMBULATORY_CARE_PROVIDER_SITE_OTHER): Payer: BC Managed Care – PPO | Admitting: Cardiology

## 2020-01-04 ENCOUNTER — Other Ambulatory Visit: Payer: Self-pay

## 2020-01-04 VITALS — BP 138/88 | HR 84 | Ht 70.0 in | Wt 224.0 lb

## 2020-01-04 DIAGNOSIS — I2510 Atherosclerotic heart disease of native coronary artery without angina pectoris: Secondary | ICD-10-CM

## 2020-01-04 DIAGNOSIS — E7800 Pure hypercholesterolemia, unspecified: Secondary | ICD-10-CM

## 2020-01-04 DIAGNOSIS — I10 Essential (primary) hypertension: Secondary | ICD-10-CM

## 2020-01-04 NOTE — Patient Instructions (Signed)

## 2020-01-08 ENCOUNTER — Ambulatory Visit: Payer: BC Managed Care – PPO | Attending: Internal Medicine

## 2020-01-08 DIAGNOSIS — Z23 Encounter for immunization: Secondary | ICD-10-CM

## 2020-01-08 NOTE — Progress Notes (Signed)
   Covid-19 Vaccination Clinic  Name:  Wyndham Santilli    MRN: [id number] DOB: 08-19-1955  01/08/2020  Mr. Treml was observed post Covid-19 immunization for 15 minutes without incident. He was provided with Vaccine Information Sheet and instruction to access the V-Safe system.   Mr. Newey was instructed to call 911 with any severe reactions post vaccine: Marland Kitchen Difficulty breathing  . Swelling of face and throat  . A fast heartbeat  . A bad rash all over body  . Dizziness and weakness   Immunizations Administered    Name Date Dose VIS Date Route   Pfizer COVID-19 Vaccine 01/08/2020  3:26 PM 0.3 mL 10/21/2019 Intramuscular   Manufacturer: [employer], [employer]   Lot: [id number]   NDC: [address]-3

## 2020-02-16 ENCOUNTER — Ambulatory Visit: Payer: BC Managed Care – PPO | Admitting: Cardiology

## 2020-02-23 DIAGNOSIS — R7303 Prediabetes: Secondary | ICD-10-CM | POA: Diagnosis not present

## 2020-02-23 DIAGNOSIS — I10 Essential (primary) hypertension: Secondary | ICD-10-CM | POA: Diagnosis not present

## 2020-02-23 DIAGNOSIS — I25119 Atherosclerotic heart disease of native coronary artery with unspecified angina pectoris: Secondary | ICD-10-CM | POA: Diagnosis not present

## 2020-02-25 DIAGNOSIS — I25119 Atherosclerotic heart disease of native coronary artery with unspecified angina pectoris: Secondary | ICD-10-CM | POA: Diagnosis not present

## 2020-02-25 DIAGNOSIS — Z7982 Long term (current) use of aspirin: Secondary | ICD-10-CM | POA: Diagnosis not present

## 2020-02-25 DIAGNOSIS — I10 Essential (primary) hypertension: Secondary | ICD-10-CM | POA: Diagnosis not present

## 2020-02-25 DIAGNOSIS — E119 Type 2 diabetes mellitus without complications: Secondary | ICD-10-CM | POA: Diagnosis not present

## 2020-03-07 ENCOUNTER — Encounter: Payer: Self-pay | Admitting: *Deleted

## 2020-04-25 NOTE — Progress Notes (Signed)
HPI: FU coronary artery disease.  Patient seen November 2021 with chest pain.  Cardiac CT December 2021 showed calcium score 363, moderate (50 to 69%) stenosis in the OM, otherwise mild nonobstructive coronary artery disease and aortic atherosclerosis. FFR 0.76 in the distal LAD and 0.63 and obtuse marginal suggestive of significant stenoses.  Abdominal ultrasound December 2021 showed no aneurysm.  Since last seen the patient denies any dyspnea on exertion, orthopnea, PND, pedal edema, palpitations, syncope or chest pain.    Current Outpatient Medications  Medication Sig Dispense Refill  . acetaminophen (TYLENOL) 650 MG CR tablet Take by mouth.    . CVS ASPIRIN EC 81 MG EC tablet 81 mg.    . ibuprofen (ADVIL) 200 MG tablet Take by mouth.    . losartan (COZAAR) 50 MG tablet Take 50 mg by mouth daily. Take 0.5 Tablet Daily    . Multiple Vitamins-Minerals (MENS 50+ MULTI VITAMIN/MIN) TABS 1 tablet    . nitroGLYCERIN (NITROSTAT) 0.4 MG SL tablet SMARTSIG:1 Sublingual Every Night    . Omega-3 Fatty Acids (FISH OIL) 1000 MG CAPS 1 capsule    . rosuvastatin (CRESTOR) 40 MG tablet Take 1 tablet (40 mg total) by mouth at bedtime. 90 tablet 3   No current facility-administered medications for this visit.     Past Medical History:  Diagnosis Date  . Depression   . Diabetes mellitus (HCC)   . Hyperlipidemia   . Hypertension     Past Surgical History:  Procedure Laterality Date  . CERVICAL DISCECTOMY      Social History   Socioeconomic History  . Marital status: Married    Spouse name: Not on file  . Number of children: 2  . Years of education: Not on file  . Highest education level: Not on file  Occupational History  . Not on file  Tobacco Use  . Smoking status: Former Smoker    Quit date: 01/01/1997    Years since quitting: 23.3  . Smokeless tobacco: Never Used  Substance and Sexual Activity  . Alcohol use: Yes    Comment: Occasional  . Drug use: No  . Sexual activity:  Not on file  Other Topics Concern  . Not on file  Social History Narrative  . Not on file   Social Determinants of Health   Financial Resource Strain: Not on file  Food Insecurity: Not on file  Transportation Needs: Not on file  Physical Activity: Not on file  Stress: Not on file  Social Connections: Not on file  Intimate Partner Violence: Not on file    Family History  Problem Relation Age of Onset  . CAD Father   . CAD Brother     ROS: no fevers or chills, productive cough, hemoptysis, dysphasia, odynophagia, melena, hematochezia, dysuria, hematuria, rash, seizure activity, orthopnea, PND, pedal edema, claudication. Remaining systems are negative.  Physical Exam: Well-developed well-nourished in no acute distress.  Skin is warm and dry.  HEENT is normal.  Neck is supple.  Chest is clear to auscultation with normal expansion.  Cardiovascular exam is regular rate and rhythm.  Abdominal exam nontender or distended. No masses palpated. Extremities show no edema. neuro grossly intact  A/P  1 coronary artery disease-as outlined previously his CTA does not suggest he needs surgical revascularization.  Our options therefore include continued medical therapy versus cardiac catheterization.  He is asymptomatic and we have elected medical therapy.  Continue aspirin, statin and beta-blocker.  Can consider catheterization in  the future if he develops symptoms.  2 hypertension-patient's blood pressure is borderline but typically controlled; continue present meds and follow.  3 hyperlipidemia-continue statin.  Olga Millers, MD

## 2020-05-03 ENCOUNTER — Ambulatory Visit (INDEPENDENT_AMBULATORY_CARE_PROVIDER_SITE_OTHER): Payer: BC Managed Care – PPO | Admitting: Cardiology

## 2020-05-03 ENCOUNTER — Encounter: Payer: Self-pay | Admitting: Cardiology

## 2020-05-03 ENCOUNTER — Other Ambulatory Visit: Payer: Self-pay

## 2020-05-03 VITALS — BP 140/82 | HR 84 | Ht 71.0 in | Wt 218.0 lb

## 2020-05-03 DIAGNOSIS — I10 Essential (primary) hypertension: Secondary | ICD-10-CM

## 2020-05-03 DIAGNOSIS — E7800 Pure hypercholesterolemia, unspecified: Secondary | ICD-10-CM

## 2020-05-03 DIAGNOSIS — I2510 Atherosclerotic heart disease of native coronary artery without angina pectoris: Secondary | ICD-10-CM

## 2020-05-03 NOTE — Patient Instructions (Signed)

## 2020-07-13 DIAGNOSIS — E119 Type 2 diabetes mellitus without complications: Secondary | ICD-10-CM | POA: Diagnosis not present

## 2020-07-13 DIAGNOSIS — I25119 Atherosclerotic heart disease of native coronary artery with unspecified angina pectoris: Secondary | ICD-10-CM | POA: Diagnosis not present

## 2020-07-13 DIAGNOSIS — I10 Essential (primary) hypertension: Secondary | ICD-10-CM | POA: Diagnosis not present

## 2020-07-14 DIAGNOSIS — I10 Essential (primary) hypertension: Secondary | ICD-10-CM | POA: Diagnosis not present

## 2020-07-14 DIAGNOSIS — E7800 Pure hypercholesterolemia, unspecified: Secondary | ICD-10-CM | POA: Diagnosis not present

## 2020-07-14 DIAGNOSIS — I25119 Atherosclerotic heart disease of native coronary artery with unspecified angina pectoris: Secondary | ICD-10-CM | POA: Diagnosis not present

## 2020-07-14 DIAGNOSIS — E119 Type 2 diabetes mellitus without complications: Secondary | ICD-10-CM | POA: Diagnosis not present

## 2020-10-14 DIAGNOSIS — Z125 Encounter for screening for malignant neoplasm of prostate: Secondary | ICD-10-CM | POA: Diagnosis not present

## 2020-10-14 DIAGNOSIS — Z0000 Encounter for general adult medical examination without abnormal findings: Secondary | ICD-10-CM | POA: Diagnosis not present

## 2020-10-17 DIAGNOSIS — E119 Type 2 diabetes mellitus without complications: Secondary | ICD-10-CM | POA: Diagnosis not present

## 2020-10-19 DIAGNOSIS — Z0000 Encounter for general adult medical examination without abnormal findings: Secondary | ICD-10-CM | POA: Diagnosis not present

## 2020-10-19 DIAGNOSIS — I25119 Atherosclerotic heart disease of native coronary artery with unspecified angina pectoris: Secondary | ICD-10-CM | POA: Diagnosis not present

## 2020-10-19 DIAGNOSIS — K429 Umbilical hernia without obstruction or gangrene: Secondary | ICD-10-CM | POA: Diagnosis not present

## 2020-10-19 DIAGNOSIS — I10 Essential (primary) hypertension: Secondary | ICD-10-CM | POA: Diagnosis not present

## 2020-10-19 DIAGNOSIS — E118 Type 2 diabetes mellitus with unspecified complications: Secondary | ICD-10-CM | POA: Diagnosis not present

## 2020-11-12 ENCOUNTER — Other Ambulatory Visit: Payer: Self-pay | Admitting: Cardiology

## 2020-11-12 DIAGNOSIS — E7800 Pure hypercholesterolemia, unspecified: Secondary | ICD-10-CM

## 2020-12-05 DIAGNOSIS — E669 Obesity, unspecified: Secondary | ICD-10-CM | POA: Diagnosis not present

## 2020-12-05 DIAGNOSIS — K429 Umbilical hernia without obstruction or gangrene: Secondary | ICD-10-CM | POA: Diagnosis not present

## 2020-12-05 DIAGNOSIS — E1169 Type 2 diabetes mellitus with other specified complication: Secondary | ICD-10-CM | POA: Diagnosis not present

## 2020-12-05 DIAGNOSIS — I2510 Atherosclerotic heart disease of native coronary artery without angina pectoris: Secondary | ICD-10-CM | POA: Diagnosis not present

## 2020-12-12 DIAGNOSIS — Z86010 Personal history of colonic polyps: Secondary | ICD-10-CM | POA: Diagnosis not present

## 2020-12-12 DIAGNOSIS — I2510 Atherosclerotic heart disease of native coronary artery without angina pectoris: Secondary | ICD-10-CM | POA: Diagnosis not present

## 2020-12-12 DIAGNOSIS — H2513 Age-related nuclear cataract, bilateral: Secondary | ICD-10-CM | POA: Diagnosis not present

## 2020-12-12 DIAGNOSIS — H52223 Regular astigmatism, bilateral: Secondary | ICD-10-CM | POA: Diagnosis not present

## 2020-12-12 DIAGNOSIS — H524 Presbyopia: Secondary | ICD-10-CM | POA: Diagnosis not present

## 2020-12-12 DIAGNOSIS — E119 Type 2 diabetes mellitus without complications: Secondary | ICD-10-CM | POA: Diagnosis not present

## 2020-12-12 DIAGNOSIS — R0602 Shortness of breath: Secondary | ICD-10-CM | POA: Diagnosis not present

## 2021-01-10 ENCOUNTER — Telehealth: Payer: Self-pay

## 2021-01-10 NOTE — Telephone Encounter (Signed)
° °  Primary Cardiologist: None  Chart reviewed as part of pre-operative protocol coverage. Given past medical history and time since last visit, based on ACC/AHA guidelines, Matthew Lambert would be at acceptable risk for the planned procedure without further cardiovascular testing.   His aspirin may be held for 5-7 days prior to his procedure.  Please resume as soon as hemostasis is achieved.  I will route this recommendation to the requesting party via Epic fax function and remove from pre-op pool.  Please call with questions.  Thomasene Ripple. [name] NP-C    01/10/2021, 1:34 PM [facility] [address] Office [phone] Fax [phone]

## 2021-01-10 NOTE — Telephone Encounter (Signed)
° °  Pre-operative Risk Assessment    Patient Name: Kashdyn Westmeyer  DOB: May 05, 1955 MRN: [id number]      Request for Surgical Clearance    Procedure:   Colonoscopy  Date of Surgery:  Clearance 01/19/21                                 Surgeon:  Dr.Patrick Benson [place] Surgeon's Group or Practice Name:  [facility] Phone number:  [phone] Fax number:  [phone]   Type of Clearance Requested:   - Pharmacy:  Hold Aspirin     Type of Anesthesia:   Propofol   Additional requests/questions:  Please advise surgeon/provider what medications should be held.  Evalee Mutton   01/10/2021, 12:56 PM

## 2021-01-19 DIAGNOSIS — K635 Polyp of colon: Secondary | ICD-10-CM | POA: Diagnosis not present

## 2021-01-19 DIAGNOSIS — Z86010 Personal history of colonic polyps: Secondary | ICD-10-CM | POA: Diagnosis not present

## 2021-01-19 DIAGNOSIS — D123 Benign neoplasm of transverse colon: Secondary | ICD-10-CM | POA: Diagnosis not present

## 2021-01-31 DIAGNOSIS — E118 Type 2 diabetes mellitus with unspecified complications: Secondary | ICD-10-CM | POA: Diagnosis not present

## 2021-01-31 DIAGNOSIS — E7800 Pure hypercholesterolemia, unspecified: Secondary | ICD-10-CM | POA: Diagnosis not present

## 2021-01-31 DIAGNOSIS — I10 Essential (primary) hypertension: Secondary | ICD-10-CM | POA: Diagnosis not present

## 2021-01-31 DIAGNOSIS — I25119 Atherosclerotic heart disease of native coronary artery with unspecified angina pectoris: Secondary | ICD-10-CM | POA: Diagnosis not present

## 2021-02-25 ENCOUNTER — Other Ambulatory Visit: Payer: Self-pay | Admitting: Cardiology

## 2021-02-25 DIAGNOSIS — E7800 Pure hypercholesterolemia, unspecified: Secondary | ICD-10-CM

## 2021-04-21 IMAGING — CT CT HEART MORP W/ CTA COR W/ SCORE W/ CA W/CM &/OR W/O CM
4 of 7 series · 8 of 20 positions shown, 9 images · IV contrast (APPLIED)
Comparison: None.
COMPARISON: None.

Addendum:
EXAM:
OVER-READ INTERPRETATION  CT CHEST

The following report is an over-read performed by radiologist Dr.
Viren Engler [REDACTED] on 12/08/2019. This over-read
does not include interpretation of cardiac or coronary anatomy or
pathology. The coronary CTA interpretation by the cardiologist is
attached.
CLINICAL DATA: 64 yo male with chest pain
Cardiac/Coronary  CT
TECHNIQUE: The patient was scanned on a Phillips Force scanner.

[Series 6: best diast 74 % · axial · 0.39mm/px · z∈[+1132,+1167]mm · 2 of 263 slices shown, 3 images]
[im 88/263  vessel]
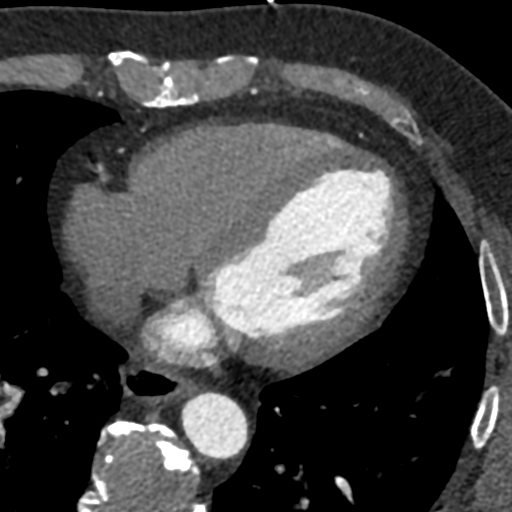
[im 88/263  lung]
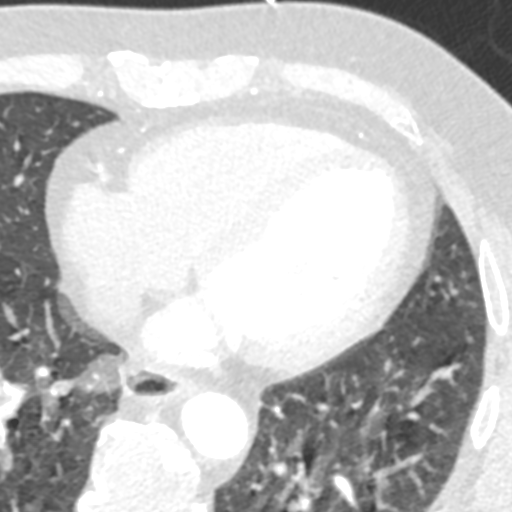
[im 175/263  vessel]
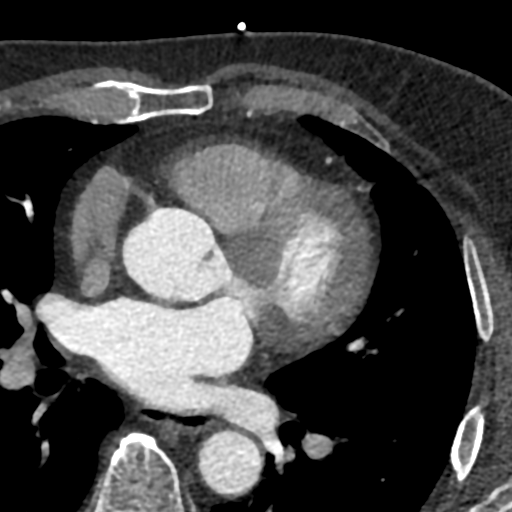

[Series 7: best syst 40 % · axial · 0.39mm/px · z∈[+1132,+1167]mm · 2 of 263 slices shown]
[im 88/263  vessel]
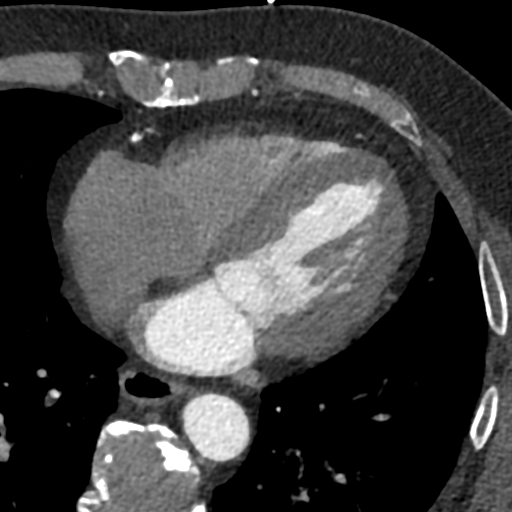
[im 175/263  vessel]
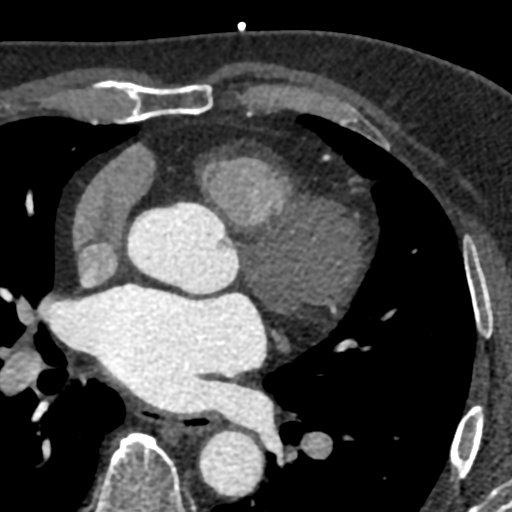

[Series 8: ts diast sharp 74 % · axial · 0.39mm/px · z∈[+1132,+1167]mm · 2 of 263 slices shown]
[im 88/263  lung]
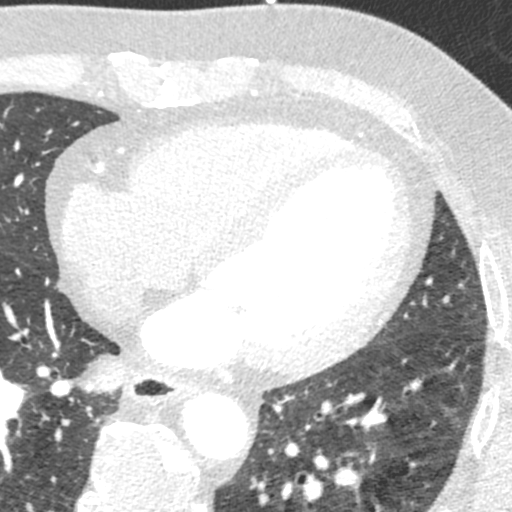
[im 175/263  lung]
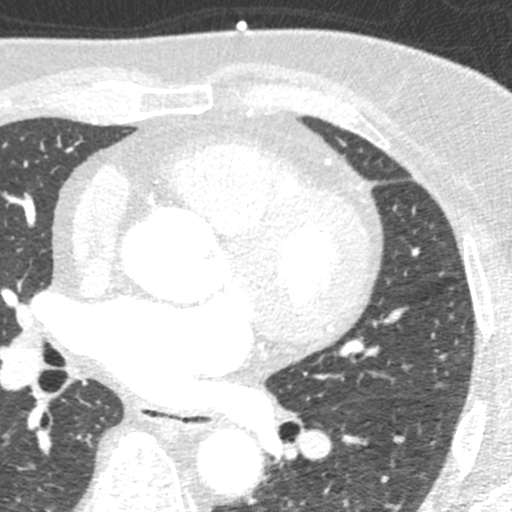

[Series 9: ts syst sharp 40 % · axial · 0.39mm/px · z∈[+1132,+1167]mm · 2 of 263 slices shown]
[im 88/263  lung]
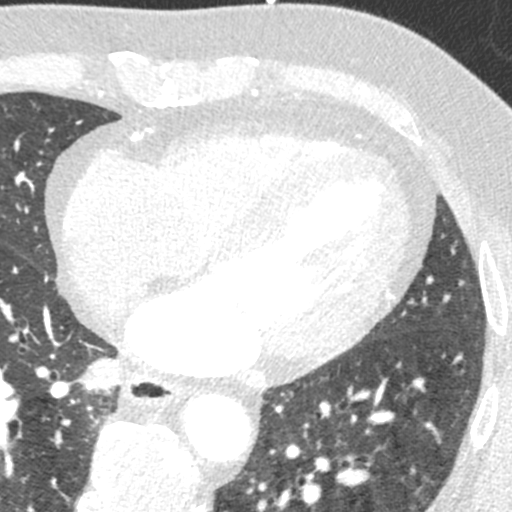
[im 175/263  lung]
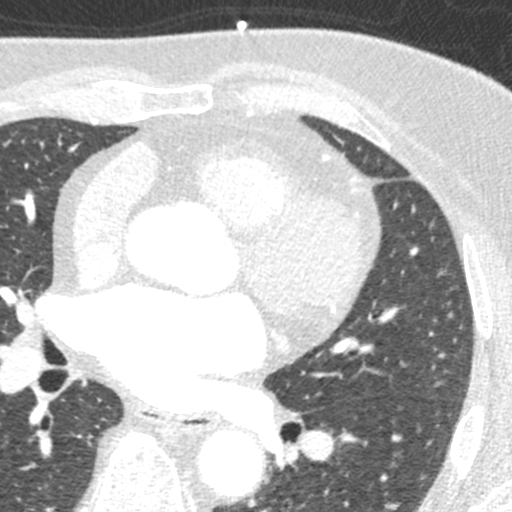

[8 of 20 positions shown; findings below may reference images not displayed]

FINDINGS: Vascular: Heart is normal size. Visualized aorta normal caliber.
Minimum scattered calcifications in the descending thoracic aorta.

Mediastinum/Nodes: No adenopathy.

Lungs/Pleura: No confluent opacities or effusions.

Upper Abdomen: Imaging into the upper abdomen demonstrates no acute
findings.

Musculoskeletal: Chest wall soft tissues are unremarkable. No acute
bony abnormality.
IMPRESSION: No acute extra cardiac abnormality.
FINDINGS: A 120 kV prospective scan was triggered in the descending thoracic
aorta at 111 HU's. Axial non-contrast 3 mm slices were carried out
through the heart. The data set was analyzed on a dedicated work
station and scored using the Agatson method. Gantry rotation speed
was 250 msecs and collimation was .6 mm. No beta blockade and 0.8 mg
of sl NTG was given. The 3D data set was reconstructed in 5%
intervals of the 67-82 % of the R-R cycle. Diastolic phases were
analyzed on a dedicated work station using MPR, MIP and VRT modes.
The patient received 80 cc of contrast.

Aorta:  Normal size.  Aortic atherosclerosis noted.  No dissection.

Aortic Valve:  Trileaflet.  No calcifications.

Coronary Arteries:  Normal coronary origin.  Right dominance.

RCA is a large dominant artery that gives rise to PDA and PLVB.
There is minimal (0-24%) calcified stenosis in the proximal vessel
followed by mild (25-49%) soft plaque stenosis followed by minimal
(0-24%) calcified stenosis.

Left main is a large artery that gives rise to LAD and LCX arteries.
There is minimal (0-24%) calcified stenosis.

LAD is a large vessel that gives rise to large branching diagonal.
There is minimal (0-24%) calcified stenosis in the proximal and mid
vessel.

LCX is a small non-dominant artery that gives rise to one large OM1
branch. There is minimal (0-24%) calcified plaque in the ostium and
moderate (50-69%) calcified stenosis in the proximal vessel.

Other findings:

Normal pulmonary vein drainage into the left atrium.

Normal let atrial appendage without a thrombus.

Normal size of the pulmonary artery.
IMPRESSION: 1. Coronary calcium score of 363. This was 78 percentile for age and
sex matched control.

2. Normal coronary origin with right dominance.

3. Moderate (50-69%)  stenosis in proximal OM; CAD-RADS 3.

4. Aortic atherosclerosis

5. Study will be sent for FFR.

Abimelk Tiger

*** End of Addendum ***
EXAM:
OVER-READ INTERPRETATION  CT CHEST

The following report is an over-read performed by radiologist Dr.
Viren Engler [REDACTED] on 12/08/2019. This over-read
does not include interpretation of cardiac or coronary anatomy or
pathology. The coronary CTA interpretation by the cardiologist is
attached.
FINDINGS: Vascular: Heart is normal size. Visualized aorta normal caliber.
Minimum scattered calcifications in the descending thoracic aorta.

Mediastinum/Nodes: No adenopathy.

Lungs/Pleura: No confluent opacities or effusions.

Upper Abdomen: Imaging into the upper abdomen demonstrates no acute
findings.

Musculoskeletal: Chest wall soft tissues are unremarkable. No acute
bony abnormality.
IMPRESSION: No acute extra cardiac abnormality.

## 2021-05-25 ENCOUNTER — Ambulatory Visit: Payer: BC Managed Care – PPO | Admitting: Cardiology

## 2021-06-28 DIAGNOSIS — I25119 Atherosclerotic heart disease of native coronary artery with unspecified angina pectoris: Secondary | ICD-10-CM | POA: Diagnosis not present

## 2021-06-28 DIAGNOSIS — I10 Essential (primary) hypertension: Secondary | ICD-10-CM | POA: Diagnosis not present

## 2021-06-28 DIAGNOSIS — E1169 Type 2 diabetes mellitus with other specified complication: Secondary | ICD-10-CM | POA: Diagnosis not present

## 2021-06-28 DIAGNOSIS — E7800 Pure hypercholesterolemia, unspecified: Secondary | ICD-10-CM | POA: Diagnosis not present

## 2021-09-05 NOTE — Progress Notes (Signed)
HPI: FU coronary artery disease.  Patient seen November 2021 with chest pain.  Cardiac CT December 2021 showed calcium score 363, moderate (50 to 69%) stenosis in the OM, otherwise mild nonobstructive coronary artery disease and aortic atherosclerosis. FFR 0.76 in the distal LAD and 0.63 and obtuse marginal suggestive of significant stenoses.  Abdominal ultrasound December 2021 showed no aneurysm.  Since last seen there is no dyspnea, chest pain, palpitations or syncope.  Current Outpatient Medications  Medication Sig Dispense Refill   acetaminophen (TYLENOL) 650 MG CR tablet Take by mouth.     CVS ASPIRIN EC 81 MG EC tablet 81 mg.     ibuprofen (ADVIL) 200 MG tablet Take by mouth.     losartan (COZAAR) 50 MG tablet Take 50 mg by mouth daily. Take 0.5 Tablet Daily     Multiple Vitamins-Minerals (MENS 50+ MULTI VITAMIN/MIN) TABS 1 tablet     nitroGLYCERIN (NITROSTAT) 0.4 MG SL tablet SMARTSIG:1 Sublingual Every Night     Omega-3 Fatty Acids (FISH OIL) 1000 MG CAPS 1 capsule     rosuvastatin (CRESTOR) 40 MG tablet TAKE 1 TABLET(40 MG) BY MOUTH AT BEDTIME 90 tablet 3   No current facility-administered medications for this visit.     Past Medical History:  Diagnosis Date   Depression    Diabetes mellitus (HCC)    Hyperlipidemia    Hypertension     Past Surgical History:  Procedure Laterality Date   CERVICAL DISCECTOMY      Social History   Socioeconomic History   Marital status: Married    Spouse name: Not on file   Number of children: 2   Years of education: Not on file   Highest education level: Not on file  Occupational History   Not on file  Tobacco Use   Smoking status: Former    Types: Cigarettes    Quit date: 01/01/1997    Years since quitting: 24.7   Smokeless tobacco: Never  Substance and Sexual Activity   Alcohol use: Yes    Comment: Occasional   Drug use: No   Sexual activity: Not on file  Other Topics Concern   Not on file  Social History Narrative    Not on file   Social Determinants of Health   Financial Resource Strain: Not on file  Food Insecurity: Not on file  Transportation Needs: Not on file  Physical Activity: Not on file  Stress: Not on file  Social Connections: Not on file  Intimate Partner Violence: Not on file    Family History  Problem Relation Age of Onset   CAD Father    CAD Brother     ROS: no fevers or chills, productive cough, hemoptysis, dysphasia, odynophagia, melena, hematochezia, dysuria, hematuria, rash, seizure activity, orthopnea, PND, pedal edema, claudication. Remaining systems are negative.  Physical Exam: Well-developed well-nourished in no acute distress.  Skin is warm and dry.  HEENT is normal.  Neck is supple.  Chest is clear to auscultation with normal expansion.  Cardiovascular exam is regular rate and rhythm.  Abdominal exam nontender or distended. No masses palpated. Extremities show no edema. neuro grossly intact  ECG-sinus bradycardia at a rate of 53, no ST changes.  Personally reviewed  A/P  1 coronary artery disease-patient denies chest pain.  Previous CTA not suggestive of surgical disease.  Given that he is asymptomatic we have chosen to continue medical therapy.  Can consider proceeding with catheterization in the future if he develops symptoms.  Continue aspirin, statin and beta-blocker.  2 hyperlipidemia-continue statin.  We will have most recent lipids and liver forwarded to [place] from primary care.  3 hypertension-patient's blood pressure is controlled.  Continue present medications.  We will have most recent potassium and renal function forwarded to [place] from primary care.  Olga Millers, MD

## 2021-09-14 ENCOUNTER — Ambulatory Visit: Payer: Medicare Other | Attending: Cardiology | Admitting: Cardiology

## 2021-09-14 ENCOUNTER — Encounter: Payer: Self-pay | Admitting: Cardiology

## 2021-09-14 VITALS — BP 118/64 | HR 53 | Ht 71.0 in | Wt 201.0 lb

## 2021-09-14 DIAGNOSIS — I2510 Atherosclerotic heart disease of native coronary artery without angina pectoris: Secondary | ICD-10-CM | POA: Diagnosis not present

## 2021-09-14 DIAGNOSIS — I10 Essential (primary) hypertension: Secondary | ICD-10-CM

## 2021-09-14 DIAGNOSIS — E7800 Pure hypercholesterolemia, unspecified: Secondary | ICD-10-CM | POA: Diagnosis not present

## 2021-09-14 MED ORDER — CICLOPIROX OLAMINE 0.77 % EX CREA: TOPICAL_CREAM | Freq: Two times a day (BID) | CUTANEOUS | 1 refills | Status: AC

## 2021-09-14 NOTE — Patient Instructions (Signed)
    Follow-Up: At [facility], you and your health needs are our priority.  As part of our continuing mission to provide you with exceptional heart care, we have created designated Provider Care Teams.  These Care Teams include your primary Cardiologist (physician) and Advanced Practice Providers (APPs -  Physician Assistants and Nurse Practitioners) who all work together to provide you with the care you need, when you need it.  We recommend signing up for the patient portal called "MyChart".  Sign up information is provided on this After Visit Summary.  MyChart is used to connect with patients for Virtual Visits (Telemedicine).  Patients are able to view lab/test results, encounter notes, upcoming appointments, etc.  Non-urgent messages can be sent to your provider as well.   To learn more about what you can do with MyChart, go to [email or web address].    Your next appointment:   12 month(s)  The format for your next appointment:   In Person  Provider:   Brian Crenshaw MD          

## 2021-10-19 DIAGNOSIS — E1169 Type 2 diabetes mellitus with other specified complication: Secondary | ICD-10-CM | POA: Diagnosis not present

## 2021-10-19 DIAGNOSIS — Z125 Encounter for screening for malignant neoplasm of prostate: Secondary | ICD-10-CM | POA: Diagnosis not present

## 2021-10-19 DIAGNOSIS — I10 Essential (primary) hypertension: Secondary | ICD-10-CM | POA: Diagnosis not present

## 2021-10-23 DIAGNOSIS — E7800 Pure hypercholesterolemia, unspecified: Secondary | ICD-10-CM | POA: Diagnosis not present

## 2021-10-23 DIAGNOSIS — I10 Essential (primary) hypertension: Secondary | ICD-10-CM | POA: Diagnosis not present

## 2021-10-23 DIAGNOSIS — I2510 Atherosclerotic heart disease of native coronary artery without angina pectoris: Secondary | ICD-10-CM | POA: Diagnosis not present

## 2021-10-23 DIAGNOSIS — E1169 Type 2 diabetes mellitus with other specified complication: Secondary | ICD-10-CM | POA: Diagnosis not present

## 2021-11-06 ENCOUNTER — Encounter: Payer: Self-pay | Admitting: *Deleted

## 2021-11-09 DIAGNOSIS — X32XXXA Exposure to sunlight, initial encounter: Secondary | ICD-10-CM | POA: Diagnosis not present

## 2021-11-09 DIAGNOSIS — L821 Other seborrheic keratosis: Secondary | ICD-10-CM | POA: Diagnosis not present

## 2021-11-09 DIAGNOSIS — L570 Actinic keratosis: Secondary | ICD-10-CM | POA: Diagnosis not present

## 2021-11-09 DIAGNOSIS — D225 Melanocytic nevi of trunk: Secondary | ICD-10-CM | POA: Diagnosis not present

## 2022-02-07 DIAGNOSIS — E7800 Pure hypercholesterolemia, unspecified: Secondary | ICD-10-CM | POA: Diagnosis not present

## 2022-02-07 DIAGNOSIS — E1169 Type 2 diabetes mellitus with other specified complication: Secondary | ICD-10-CM | POA: Diagnosis not present

## 2022-02-07 DIAGNOSIS — I10 Essential (primary) hypertension: Secondary | ICD-10-CM | POA: Diagnosis not present

## 2022-02-07 DIAGNOSIS — I25119 Atherosclerotic heart disease of native coronary artery with unspecified angina pectoris: Secondary | ICD-10-CM | POA: Diagnosis not present

## 2022-02-12 DIAGNOSIS — H2513 Age-related nuclear cataract, bilateral: Secondary | ICD-10-CM | POA: Diagnosis not present

## 2022-02-12 DIAGNOSIS — E119 Type 2 diabetes mellitus without complications: Secondary | ICD-10-CM | POA: Diagnosis not present

## 2022-02-12 DIAGNOSIS — H35033 Hypertensive retinopathy, bilateral: Secondary | ICD-10-CM | POA: Diagnosis not present

## 2022-02-22 DIAGNOSIS — I10 Essential (primary) hypertension: Secondary | ICD-10-CM | POA: Diagnosis not present

## 2022-02-22 DIAGNOSIS — I25119 Atherosclerotic heart disease of native coronary artery with unspecified angina pectoris: Secondary | ICD-10-CM | POA: Diagnosis not present

## 2022-02-22 DIAGNOSIS — E7800 Pure hypercholesterolemia, unspecified: Secondary | ICD-10-CM | POA: Diagnosis not present

## 2022-02-22 DIAGNOSIS — E1169 Type 2 diabetes mellitus with other specified complication: Secondary | ICD-10-CM | POA: Diagnosis not present

## 2022-03-12 DIAGNOSIS — E1169 Type 2 diabetes mellitus with other specified complication: Secondary | ICD-10-CM | POA: Diagnosis not present

## 2022-05-29 DIAGNOSIS — I10 Essential (primary) hypertension: Secondary | ICD-10-CM | POA: Diagnosis not present

## 2022-05-29 DIAGNOSIS — E1169 Type 2 diabetes mellitus with other specified complication: Secondary | ICD-10-CM | POA: Diagnosis not present

## 2022-05-29 DIAGNOSIS — R809 Proteinuria, unspecified: Secondary | ICD-10-CM | POA: Diagnosis not present

## 2022-05-29 DIAGNOSIS — E7800 Pure hypercholesterolemia, unspecified: Secondary | ICD-10-CM | POA: Diagnosis not present

## 2022-06-05 DIAGNOSIS — E7800 Pure hypercholesterolemia, unspecified: Secondary | ICD-10-CM | POA: Diagnosis not present

## 2022-06-05 DIAGNOSIS — R809 Proteinuria, unspecified: Secondary | ICD-10-CM | POA: Diagnosis not present

## 2022-06-05 DIAGNOSIS — E1169 Type 2 diabetes mellitus with other specified complication: Secondary | ICD-10-CM | POA: Diagnosis not present

## 2022-06-05 DIAGNOSIS — I10 Essential (primary) hypertension: Secondary | ICD-10-CM | POA: Diagnosis not present

## 2022-07-09 DIAGNOSIS — K080 Exfoliation of teeth due to systemic causes: Secondary | ICD-10-CM | POA: Diagnosis not present

## 2022-09-17 NOTE — Progress Notes (Addendum)
HPI: FU coronary artery disease.  Patient seen November 2021 with chest pain.  Cardiac CT December 2021 showed calcium score 363, moderate (50 to 69%) stenosis in the OM, otherwise mild nonobstructive coronary artery disease and aortic atherosclerosis. FFR 0.76 in the distal LAD and 0.63 and obtuse marginal suggestive of significant stenoses.  Abdominal ultrasound December 2021 showed no aneurysm.  Since last seen the patient has dyspnea with more extreme activities but not with routine activities. It is relieved with rest. It is not associated with chest pain. There is no orthopnea, PND or pedal edema. There is no syncope or palpitations. There is no exertional chest pain.   Current Outpatient Medications  Medication Sig Dispense Refill   acetaminophen (TYLENOL) 650 MG CR tablet Take by mouth. As needed     ciclopirox (LOPROX) 0.77 % cream Apply topically 2 (two) times daily. 30 g 1   CVS ASPIRIN EC 81 MG EC tablet 81 mg.     Finerenone (KERENDIA) 10 MG TABS Take 10 mg by mouth daily.     ibuprofen (ADVIL) 200 MG tablet Take by mouth.     losartan (COZAAR) 50 MG tablet Take 50 mg by mouth daily. Take 0.5 Tablet Daily     metFORMIN (GLUCOPHAGE) 1000 MG tablet Take 1,000 mg by mouth daily with breakfast.     Multiple Vitamins-Minerals (MENS 50+ MULTI VITAMIN/MIN) TABS 1 tablet     nitroGLYCERIN (NITROSTAT) 0.4 MG SL tablet As needed     rosuvastatin (CRESTOR) 40 MG tablet TAKE 1 TABLET(40 MG) BY MOUTH AT BEDTIME 90 tablet 3   Omega-3 Fatty Acids (FISH OIL) 1000 MG CAPS 1 capsule (Patient not taking: Reported on 09/26/2022)     No current facility-administered medications for this visit.     Past Medical History:  Diagnosis Date   Depression    Diabetes mellitus (HCC)    Hyperlipidemia    Hypertension     Past Surgical History:  Procedure Laterality Date   CERVICAL DISCECTOMY      Social History   Socioeconomic History   Marital status: Married    Spouse name: Not on file    Number of children: 2   Years of education: Not on file   Highest education level: Not on file  Occupational History   Not on file  Tobacco Use   Smoking status: Former    Current packs/day: 0.00    Types: Cigarettes    Quit date: 01/01/1997    Years since quitting: 25.7   Smokeless tobacco: Never  Substance and Sexual Activity   Alcohol use: Yes    Comment: Occasional   Drug use: No   Sexual activity: Not on file  Other Topics Concern   Not on file  Social History Narrative   Not on file   Social Determinants of Health   Financial Resource Strain: Not on file  Food Insecurity: Not on file  Transportation Needs: Not on file  Physical Activity: Not on file  Stress: Not on file  Social Connections: Unknown (05/16/2021)   Received from [facility], [facility]   Social Network    Social Network: Not on file  Intimate Partner Violence: Unknown (04/07/2021)   Received from [facility], [facility]   HITS    Physically Hurt: Not on file    Insult or Talk Down To: Not on file    Threaten Physical Harm: Not on file    Scream or Curse: Not on file  Family History  Problem Relation Age of Onset   CAD Father    CAD Brother     ROS: no fevers or chills, productive cough, hemoptysis, dysphasia, odynophagia, melena, hematochezia, dysuria, hematuria, rash, seizure activity, orthopnea, PND, pedal edema, claudication. Remaining systems are negative.  Physical Exam: Well-developed well-nourished in no acute distress.  Skin is warm and dry.  HEENT is normal.  Neck is supple.  Chest is clear to auscultation with normal expansion.  Cardiovascular exam is regular rate and rhythm.  Abdominal exam nontender or distended. No masses palpated. Extremities show no edema. neuro grossly intact  EKG Interpretation Date/Time:  Wednesday September 26 2022 08:01:08 EDT Ventricular Rate:  55 PR Interval:  170 QRS Duration:  90 QT Interval:  422 QTC Calculation: 403 R  Axis:   27  Text Interpretation: Sinus bradycardia No previous ECGs available Confirmed by Olga Millers ([id number]) on 09/26/2022 8:49:05 AM    A/P  1 coronary artery disease-as outlined previously prior CTA not suggestive of surgical disease and patient remains asymptomatic.  We have therefore treated medically.  Aspirin, statin and beta-blocker.  Can consider cardiac catheterization in the future if he develops worsening symptoms.  2 hypertension-blood pressure controlled.  Continue present medical regimen.  Will have most recent potassium and renal function forwarded to [place] from primary care.  3 hyperlipidemia-continue statin.  Will have most recent lipids and liver forwarded to [place] from primary care.  Olga Millers, MD

## 2022-09-26 ENCOUNTER — Ambulatory Visit: Payer: Medicare Other | Attending: Cardiology | Admitting: Cardiology

## 2022-09-26 ENCOUNTER — Encounter: Payer: Self-pay | Admitting: Cardiology

## 2022-09-26 VITALS — BP 132/84 | HR 55 | Ht 71.0 in | Wt 195.6 lb

## 2022-09-26 DIAGNOSIS — E7800 Pure hypercholesterolemia, unspecified: Secondary | ICD-10-CM

## 2022-09-26 DIAGNOSIS — I2510 Atherosclerotic heart disease of native coronary artery without angina pectoris: Secondary | ICD-10-CM

## 2022-09-26 DIAGNOSIS — I10 Essential (primary) hypertension: Secondary | ICD-10-CM | POA: Diagnosis not present

## 2022-10-03 DIAGNOSIS — K080 Exfoliation of teeth due to systemic causes: Secondary | ICD-10-CM | POA: Diagnosis not present

## 2022-10-23 DIAGNOSIS — I10 Essential (primary) hypertension: Secondary | ICD-10-CM | POA: Diagnosis not present

## 2022-10-23 DIAGNOSIS — R7309 Other abnormal glucose: Secondary | ICD-10-CM | POA: Diagnosis not present

## 2022-10-23 DIAGNOSIS — Z125 Encounter for screening for malignant neoplasm of prostate: Secondary | ICD-10-CM | POA: Diagnosis not present

## 2022-10-24 LAB — LAB REPORT - SCANNED
A1c: 5.9
EGFR: 76

## 2022-10-26 DIAGNOSIS — I25119 Atherosclerotic heart disease of native coronary artery with unspecified angina pectoris: Secondary | ICD-10-CM | POA: Diagnosis not present

## 2022-10-26 DIAGNOSIS — I10 Essential (primary) hypertension: Secondary | ICD-10-CM | POA: Diagnosis not present

## 2022-10-26 DIAGNOSIS — L570 Actinic keratosis: Secondary | ICD-10-CM | POA: Diagnosis not present

## 2022-10-26 DIAGNOSIS — N401 Enlarged prostate with lower urinary tract symptoms: Secondary | ICD-10-CM | POA: Diagnosis not present

## 2022-10-26 DIAGNOSIS — E118 Type 2 diabetes mellitus with unspecified complications: Secondary | ICD-10-CM | POA: Diagnosis not present

## 2022-10-26 DIAGNOSIS — Z0000 Encounter for general adult medical examination without abnormal findings: Secondary | ICD-10-CM | POA: Diagnosis not present

## 2023-02-05 DIAGNOSIS — I10 Essential (primary) hypertension: Secondary | ICD-10-CM | POA: Diagnosis not present

## 2023-02-05 DIAGNOSIS — E1169 Type 2 diabetes mellitus with other specified complication: Secondary | ICD-10-CM | POA: Diagnosis not present

## 2023-02-05 DIAGNOSIS — R809 Proteinuria, unspecified: Secondary | ICD-10-CM | POA: Diagnosis not present

## 2023-02-05 DIAGNOSIS — E7800 Pure hypercholesterolemia, unspecified: Secondary | ICD-10-CM | POA: Diagnosis not present

## 2023-04-04 DIAGNOSIS — H35033 Hypertensive retinopathy, bilateral: Secondary | ICD-10-CM | POA: Diagnosis not present

## 2023-04-04 DIAGNOSIS — H2513 Age-related nuclear cataract, bilateral: Secondary | ICD-10-CM | POA: Diagnosis not present

## 2023-04-04 DIAGNOSIS — H04123 Dry eye syndrome of bilateral lacrimal glands: Secondary | ICD-10-CM | POA: Diagnosis not present

## 2023-04-04 DIAGNOSIS — E119 Type 2 diabetes mellitus without complications: Secondary | ICD-10-CM | POA: Diagnosis not present

## 2023-06-11 DIAGNOSIS — K080 Exfoliation of teeth due to systemic causes: Secondary | ICD-10-CM | POA: Diagnosis not present

## 2023-06-12 DIAGNOSIS — Z125 Encounter for screening for malignant neoplasm of prostate: Secondary | ICD-10-CM | POA: Diagnosis not present

## 2023-06-12 DIAGNOSIS — I10 Essential (primary) hypertension: Secondary | ICD-10-CM | POA: Diagnosis not present

## 2023-06-12 DIAGNOSIS — N182 Chronic kidney disease, stage 2 (mild): Secondary | ICD-10-CM | POA: Diagnosis not present

## 2023-06-12 DIAGNOSIS — E118 Type 2 diabetes mellitus with unspecified complications: Secondary | ICD-10-CM | POA: Diagnosis not present

## 2023-06-12 DIAGNOSIS — I25119 Atherosclerotic heart disease of native coronary artery with unspecified angina pectoris: Secondary | ICD-10-CM | POA: Diagnosis not present

## 2023-06-25 DIAGNOSIS — K080 Exfoliation of teeth due to systemic causes: Secondary | ICD-10-CM | POA: Diagnosis not present

## 2023-07-09 DIAGNOSIS — I10 Essential (primary) hypertension: Secondary | ICD-10-CM | POA: Diagnosis not present

## 2023-07-09 DIAGNOSIS — E7800 Pure hypercholesterolemia, unspecified: Secondary | ICD-10-CM | POA: Diagnosis not present

## 2023-07-09 DIAGNOSIS — R809 Proteinuria, unspecified: Secondary | ICD-10-CM | POA: Diagnosis not present

## 2023-07-09 DIAGNOSIS — E1169 Type 2 diabetes mellitus with other specified complication: Secondary | ICD-10-CM | POA: Diagnosis not present

## 2023-08-20 DIAGNOSIS — E119 Type 2 diabetes mellitus without complications: Secondary | ICD-10-CM | POA: Diagnosis not present

## 2023-08-20 DIAGNOSIS — I10 Essential (primary) hypertension: Secondary | ICD-10-CM | POA: Diagnosis not present

## 2023-08-27 DIAGNOSIS — Z0000 Encounter for general adult medical examination without abnormal findings: Secondary | ICD-10-CM | POA: Diagnosis not present

## 2023-08-27 DIAGNOSIS — E1169 Type 2 diabetes mellitus with other specified complication: Secondary | ICD-10-CM | POA: Diagnosis not present

## 2023-08-27 DIAGNOSIS — I25119 Atherosclerotic heart disease of native coronary artery with unspecified angina pectoris: Secondary | ICD-10-CM | POA: Diagnosis not present

## 2023-08-27 DIAGNOSIS — E7841 Elevated Lipoprotein(a): Secondary | ICD-10-CM | POA: Diagnosis not present

## 2023-08-27 DIAGNOSIS — I10 Essential (primary) hypertension: Secondary | ICD-10-CM | POA: Diagnosis not present

## 2023-11-21 NOTE — Progress Notes (Signed)
 HPI: FU coronary artery disease.  Patient seen November 2021 with chest pain. Cardiac CT December 2021 showed calcium  score 363, moderate (50 to 69%) stenosis in the OM, otherwise mild nonobstructive coronary artery disease and aortic atherosclerosis. FFR 0.76 in the distal LAD and 0.63 and obtuse marginal suggestive of significant stenoses.  Abdominal ultrasound December 2021 showed no aneurysm.  Since last seen there is no dyspnea, chest pain, palpitations or syncope.  Current Outpatient Medications  Medication Sig Dispense Refill   acetaminophen (TYLENOL) 650 MG CR tablet Take by mouth. As needed     ciclopirox  (LOPROX ) 0.77 % cream Apply topically 2 (two) times daily. 30 g 1   [employer] ASPIRIN EC 81 MG EC tablet 81 mg.     Finerenone (KERENDIA) 10 MG TABS Take 10 mg by mouth daily.     ibuprofen (ADVIL) 200 MG tablet Take by mouth.     losartan  (COZAAR ) 50 MG tablet Take 50 mg by mouth daily. Take 0.5 Tablet Daily     metFORMIN (GLUCOPHAGE) 1000 MG tablet Take 1,000 mg by mouth daily with breakfast.     Multiple Vitamins-Minerals (MENS 50+ MULTI VITAMIN/MIN) TABS 1 tablet     nitroGLYCERIN  (NITROSTAT ) 0.4 MG SL tablet As needed     Omega-3 Fatty Acids (FISH OIL) 1000 MG CAPS 1 capsule (Patient not taking: Reported on 09/26/2022)     rosuvastatin  (CRESTOR ) 40 MG tablet TAKE 1 TABLET(40 MG) BY MOUTH AT BEDTIME 90 tablet 3   No current facility-administered medications for this visit.     Past Medical History:  Diagnosis Date   Depression    Diabetes mellitus (HCC)    Hyperlipidemia    Hypertension     Past Surgical History:  Procedure Laterality Date   CERVICAL DISCECTOMY      Social History   Socioeconomic History   Marital status: Married    Spouse name: Not on file   Number of children: 2   Years of education: Not on file   Highest education level: Not on file  Occupational History   Not on file  Tobacco Use   Smoking status: Former    Current packs/day: 0.00     Types: Cigarettes    Quit date: 01/01/1997    Years since quitting: 26.9   Smokeless tobacco: Never  Substance and Sexual Activity   Alcohol use: Yes    Comment: Occasional   Drug use: No   Sexual activity: Not on file  Other Topics Concern   Not on file  Social History Narrative   Not on file   Social Drivers of Health   Financial Resource Strain: Not on file  Food Insecurity: Not on file  Transportation Needs: Not on file  Physical Activity: Not on file  Stress: Not on file  Social Connections: Unknown (05/16/2021)   Received from [facility]   Social Network    Social Network: Not on file  Intimate Partner Violence: Unknown (04/07/2021)   Received from [facility]   HITS    Physically Hurt: Not on file    Insult or Talk Down To: Not on file    Threaten Physical Harm: Not on file    Scream or Curse: Not on file    Family History  Problem Relation Age of Onset   CAD Father    CAD Brother     ROS: no fevers or chills, productive cough, hemoptysis, dysphasia, odynophagia, melena, hematochezia, dysuria, hematuria, rash, seizure activity, orthopnea, PND, pedal  edema, claudication. Remaining systems are negative.  Physical Exam: Well-developed well-nourished in no acute distress.  Skin is warm and dry.  HEENT is normal.  Neck is supple.  Chest is clear to auscultation with normal expansion.  Cardiovascular exam is regular rate and rhythm.  Abdominal exam nontender or distended. No masses palpated. Extremities show no edema. neuro grossly intact  EKG Interpretation Date/Time:  Wednesday December 04 2023 10:50:47 EST Ventricular Rate:  61 PR Interval:  178 QRS Duration:  86 QT Interval:  406 QTC Calculation: 408 R Axis:   31  Text Interpretation: Normal sinus rhythm with sinus arrhythmia Normal ECG Confirmed by Pietro Rogue ([id number]) on 12/04/2023 10:51:23 AM    A/P  1 coronary artery disease-previous CTA not suggestive of surgical disease and patient  continues to be asymptomatic from a cardiac standpoint.  Plan medical therapy but can consider catheterization in the future if he develops worsening symptoms.  Continue aspirin, statin and beta-blocker.  2 hyperlipidemia-continue statin.  Most recent LDL not at goal.  Add Zetia 10 mg daily.  Check lipids and liver in 8 weeks.  Note LP(a) also elevated previously.  We discussed PCSK9 inhibitor but he would prefer pills.  3 hypertension-patient's blood pressure is controlled.  Continue present medical regimen.  Rogue Pietro, MD

## 2023-11-26 ENCOUNTER — Ambulatory Visit: Admitting: Cardiology

## 2023-12-02 DIAGNOSIS — R809 Proteinuria, unspecified: Secondary | ICD-10-CM | POA: Diagnosis not present

## 2023-12-02 DIAGNOSIS — E1169 Type 2 diabetes mellitus with other specified complication: Secondary | ICD-10-CM | POA: Diagnosis not present

## 2023-12-04 ENCOUNTER — Encounter: Payer: Self-pay | Admitting: Cardiology

## 2023-12-04 ENCOUNTER — Ambulatory Visit: Attending: Cardiology | Admitting: Cardiology

## 2023-12-04 VITALS — BP 116/64 | HR 61 | Ht 71.0 in | Wt 208.0 lb

## 2023-12-04 DIAGNOSIS — I10 Essential (primary) hypertension: Secondary | ICD-10-CM | POA: Diagnosis not present

## 2023-12-04 DIAGNOSIS — I2510 Atherosclerotic heart disease of native coronary artery without angina pectoris: Secondary | ICD-10-CM

## 2023-12-04 DIAGNOSIS — E7800 Pure hypercholesterolemia, unspecified: Secondary | ICD-10-CM | POA: Diagnosis not present

## 2023-12-04 DIAGNOSIS — E1169 Type 2 diabetes mellitus with other specified complication: Secondary | ICD-10-CM | POA: Diagnosis not present

## 2023-12-04 DIAGNOSIS — R809 Proteinuria, unspecified: Secondary | ICD-10-CM | POA: Diagnosis not present

## 2023-12-04 MED ORDER — EZETIMIBE 10 MG PO TABS: 10.0000 mg | ORAL_TABLET | Freq: Every day | ORAL | 3 refills | Status: AC

## 2023-12-04 NOTE — Patient Instructions (Signed)
 Medication Instructions:   START EZETIMIBE  10 MG ONCE DAILY  *If you need a refill on your cardiac medications before your next appointment, please call your pharmacy*  Lab Work:  Your physician recommends that you return for lab work in: [facility]   If you have labs (blood work) drawn today and your tests are completely normal, you will receive your results only by: MyChart Message (if you have MyChart) OR A paper copy in the mail If you have any lab test that is abnormal or we need to change your treatment, we will call you to review the results.    Follow-Up: At [facility], you and your health needs are our priority.  As part of our continuing mission to provide you with exceptional heart care, our providers are all part of one team.  This team includes your primary Cardiologist (physician) and Advanced Practice Providers or APPs (Physician Assistants and Nurse Practitioners) who all work together to provide you with the care you need, when you need it.  Your next appointment:   12 month(s)  Provider:   Redell Shallow, MD
